# Patient Record
Sex: Female | Born: 1975 | Race: White | Hispanic: No | Marital: Married | Smoking: Never smoker
Health system: Southern US, Community
[De-identification: ages and names within clinical notes are randomized; demographics above are authoritative.]

## PROBLEM LIST (undated history)

## (undated) DIAGNOSIS — O24419 Gestational diabetes mellitus in pregnancy, unspecified control: Secondary | ICD-10-CM

## (undated) HISTORY — PX: KNEE CARTILAGE SURGERY: SHX688

## (undated) HISTORY — DX: Gestational diabetes mellitus in pregnancy, unspecified control: O24.419

---

## 1998-06-30 ENCOUNTER — Other Ambulatory Visit: Admission: RE | Admit: 1998-06-30 | Discharge: 1998-06-30 | Payer: Self-pay | Admitting: Obstetrics & Gynecology

## 1999-01-31 ENCOUNTER — Inpatient Hospital Stay (HOSPITAL_COMMUNITY): Admission: AD | Admit: 1999-01-31 | Discharge: 1999-01-31 | Payer: Self-pay | Admitting: Obstetrics and Gynecology

## 1999-02-02 ENCOUNTER — Inpatient Hospital Stay (HOSPITAL_COMMUNITY): Admission: AD | Admit: 1999-02-02 | Discharge: 1999-02-05 | Payer: Self-pay | Admitting: *Deleted

## 2001-09-19 ENCOUNTER — Inpatient Hospital Stay (HOSPITAL_COMMUNITY): Admission: AD | Admit: 2001-09-19 | Discharge: 2001-09-22 | Payer: Self-pay | Admitting: Obstetrics

## 2004-07-05 ENCOUNTER — Inpatient Hospital Stay (HOSPITAL_COMMUNITY): Admission: AD | Admit: 2004-07-05 | Discharge: 2004-07-07 | Payer: Self-pay | Admitting: Obstetrics and Gynecology

## 2005-09-24 ENCOUNTER — Emergency Department: Payer: Self-pay | Admitting: Emergency Medicine

## 2005-09-24 ENCOUNTER — Other Ambulatory Visit: Payer: Self-pay

## 2006-10-19 ENCOUNTER — Inpatient Hospital Stay: Payer: Self-pay | Admitting: Obstetrics and Gynecology

## 2010-04-04 ENCOUNTER — Inpatient Hospital Stay: Payer: Self-pay | Admitting: Obstetrics and Gynecology

## 2013-06-11 ENCOUNTER — Inpatient Hospital Stay: Payer: Self-pay

## 2013-06-11 LAB — CBC WITH DIFFERENTIAL/PLATELET
Basophil #: 0 10*3/uL (ref 0.0–0.1)
Basophil %: 0.2 %
EOS PCT: 0.2 %
Eosinophil #: 0 10*3/uL (ref 0.0–0.7)
HCT: 31.8 % — ABNORMAL LOW (ref 35.0–47.0)
HGB: 10.2 g/dL — ABNORMAL LOW (ref 12.0–16.0)
LYMPHS ABS: 1.3 10*3/uL (ref 1.0–3.6)
Lymphocyte %: 12.8 %
MCH: 26.9 pg (ref 26.0–34.0)
MCHC: 32.1 g/dL (ref 32.0–36.0)
MCV: 84 fL (ref 80–100)
Monocyte #: 0.7 x10 3/mm (ref 0.2–0.9)
Monocyte %: 7 %
Neutrophil #: 8 10*3/uL — ABNORMAL HIGH (ref 1.4–6.5)
Neutrophil %: 79.8 %
Platelet: 195 10*3/uL (ref 150–440)
RBC: 3.8 10*6/uL (ref 3.80–5.20)
RDW: 15.1 % — ABNORMAL HIGH (ref 11.5–14.5)
WBC: 10 10*3/uL (ref 3.6–11.0)

## 2013-06-13 LAB — HEMATOCRIT: HCT: 30.7 % — AB (ref 35.0–47.0)

## 2015-03-06 NOTE — L&D Delivery Note (Signed)
Delivery Note Primary OB: [facility] Delivery Physician: Ashley MajorPaul Olene Godfrey, MD Gestational Age: Full term Antepartum complications: gestational diabetes and obesity and AMA and grand multiparity Intrapartum complications: None  A viable Female was delivered via vertex perentation.  Apgars:8 ,9  Weight:  Not done yet .   Placenta status: spontaneous and Intact.  Cord: 3+ vessels;  with the following complications: none.  Anesthesia:  epidural Episiotomy:  none Lacerations:  1st Suture Repair: 2.0 vicryl none Est. Blood Loss (mL):  200 mL  Mom to postpartum.  Baby to Couplet care / Skin to Skin.  Ashley MajorPaul Sophiya Morello, MD Dept of OB/GYN [phone]

## 2015-11-29 ENCOUNTER — Encounter: Payer: Self-pay | Attending: Obstetrics & Gynecology | Admitting: *Deleted

## 2015-11-29 ENCOUNTER — Encounter: Payer: Self-pay | Admitting: *Deleted

## 2015-11-29 VITALS — BP 102/64 | Ht 70.0 in | Wt 291.3 lb

## 2015-11-29 DIAGNOSIS — O24410 Gestational diabetes mellitus in pregnancy, diet controlled: Secondary | ICD-10-CM

## 2015-11-29 DIAGNOSIS — Z713 Dietary counseling and surveillance: Secondary | ICD-10-CM | POA: Insufficient documentation

## 2015-11-29 DIAGNOSIS — O99810 Abnormal glucose complicating pregnancy: Secondary | ICD-10-CM | POA: Insufficient documentation

## 2015-11-29 NOTE — Patient Instructions (Signed)
Read booklet on Gestational Diabetes Follow Gestational Meal Planning Guidelines Limit desserts/sweets Avoid cold cereal for breakfast Complete a 3 Day Food Record and bring to next appointment Check blood sugars 4 x day - before breakfast and 2 hrs after every meal and record  Bring blood sugar log to all appointments Call MD for prescription for meter strips and lancets Strips   Accu-Chek Guide        Lancets   Accu-Chek FastClix Purchase urine ketone strips if blood sugars not controlled and check urine ketones every am:  If + increase bedtime snack to 1 protein and 2 carbohydrate servings Walk 20-30 minutes at least 5 x week if permitted by MD

## 2015-11-29 NOTE — Progress Notes (Signed)
Diabetes Self-Management Education  Visit Type: First/Initial  Appt. Start Time: 1335 Appt. End Time: 1515  11/29/2015  [name], identified by name and date of birth, is a 40 y.o. female with a diagnosis of Diabetes: Gestational Diabetes.   ASSESSMENT  Blood pressure 102/64, height 5\' 10"  (1.778 m), weight 291 lb 4.8 oz (132.1 kg), last menstrual period 04/16/2015. Body mass index is 41.8 kg/m.      Diabetes Self-Management Education - 11/29/15 1654      Visit Information   Visit Type First/Initial     Initial Visit   Diabetes Type Gestational Diabetes   Are you currently following a meal plan? No   Are you taking your medications as prescribed? Yes   Date Diagnosed 1 week ago     Health Coping   How would you rate your overall health? Good     Psychosocial Assessment   Patient Belief/Attitude about Diabetes Motivated to manage diabetes   Self-care barriers None   Self-management support Doctor's office;Internet communities   Patient Concerns Glycemic Control;Monitoring;Weight Control;Healthy Lifestyle   Special Needs None   Preferred Learning Style Auditory;Visual;Hands on   Learning Readiness Change in progress   How often do you need to have someone help you when you read instructions, pamphlets, or other written materials from your doctor or pharmacy? 1 - Never   What is the last grade level you completed in school? some college     Pre-Education Assessment   Patient understands the diabetes disease and treatment process. Needs Instruction   Patient understands incorporating nutritional management into lifestyle. Needs Instruction   Patient undertands incorporating physical activity into lifestyle. Needs Instruction   Patient understands using medications safely. Needs Instruction   Patient understands monitoring blood glucose, interpreting and using results Needs Instruction   Patient understands prevention, detection, and treatment of acute  complications. Needs Instruction   Patient understands prevention, detection, and treatment of chronic complications. Needs Instruction   Patient understands how to develop strategies to address psychosocial issues. Needs Instruction   Patient understands how to develop strategies to promote health/change behavior. Needs Instruction     Complications   How often do you check your blood sugar? 0 times/day (not testing)  Provided Accu-Chek Guide meter and instructed on use. BG upon return demonstration was 156 mg/dL at 1:61 pm - 2 1/2 hrs pp.   Have you had a dilated eye exam in the past 12 months? No   Have you had a dental exam in the past 12 months? No   Are you checking your feet? No     Dietary Intake   Breakfast cereal or apple with peanut butter or poptart or cinnamon roll   Lunch left overs or peanut butter sandwich or microwave meal   Dinner meat and potatoes, salad, spaghetti   Beverage(s) water     Exercise   Exercise Type ADL's     Patient Education   Previous Diabetes Education No   Disease state  Definition of diabetes, type 1 and 2, and the diagnosis of diabetes   Nutrition management  Role of diet in the treatment of diabetes and the relationship between the three main macronutrients and blood glucose level   Physical activity and exercise  Role of exercise on diabetes management, blood pressure control and cardiac health.   Monitoring Taught/evaluated SMBG meter.;Purpose and frequency of SMBG.;Taught/discussed recording of test results and interpretation of SMBG.;Ketone testing, when, how.   Chronic complications Relationship between chronic complications and  blood glucose control   Psychosocial adjustment Identified and addressed patients feelings and concerns about diabetes   Preconception care Pregnancy and GDM  Role of pre-pregnancy blood glucose control on the development of the fetus;Reviewed with patient blood glucose goals with pregnancy;Role of family planning for  patients with diabetes     Individualized Goals (developed by patient)   Reducing Risk Improve blood sugars Prevent diabetes complications Lose weight Lead a healthier lifestyle     Outcomes   Expected Outcomes Demonstrated interest in learning. Expect positive outcomes   Future DMSE 2 wks      Individualized Plan for Diabetes Self-Management Training:   Learning Objective:  Patient will have a greater understanding of diabetes self-management. Patient education plan is to attend individual and/or group sessions per assessed needs and concerns.   Plan:   Patient Instructions  Read booklet on Gestational Diabetes Follow Gestational Meal Planning Guidelines Limit desserts/sweets Avoid cold cereal for breakfast Complete a 3 Day Food Record and bring to next appointment Check blood sugars 4 x day - before breakfast and 2 hrs after every meal and record  Bring blood sugar log to all appointments Call MD for prescription for meter strips and lancets Strips   Accu-Chek Guide        Lancets   Accu-Chek FastClix Purchase urine ketone strips if blood sugars not controlled and check urine ketones every am:  If + increase bedtime snack to 1 protein and 2 carbohydrate servings Walk 20-30 minutes at least 5 x week if permitted by MD   Expected Outcomes:  Demonstrated interest in learning. Expect positive outcomes  Education material provided:  Gestational Booklet Gestational Meal Planning Guidelines Viewed Gestational Diabetes Video Meter = Accu-Chek Guide 3 Day Food Record Goals for a Healthy Pregnancy  If problems or questions, patient to contact team via:  Sharion SettlerSheila Shotwell, RN, CCM, CDE [phone]  Future DSME appointment: 2 wks  December 15, 2015 for appointment with the dietitian

## 2015-12-15 ENCOUNTER — Telehealth: Payer: Self-pay | Admitting: Dietician

## 2015-12-15 ENCOUNTER — Ambulatory Visit: Payer: Self-pay | Admitting: Dietician

## 2015-12-15 NOTE — Telephone Encounter (Signed)
Patient cancelled today's appointment due to another appointment at the same time. She left a message asking if she needs to reschedule as she feels she is doing well and her due date is soon. Called her home phone and left a message of the call with another person. Also called her cell phone and left a voicemail message stating that she is not obligated to reschedule considering her circumstances, but we would be glad to reschedule if she would like further assistance. Requested she call back to let us know how she would like to proceed.

## 2015-12-16 ENCOUNTER — Encounter: Payer: Self-pay | Admitting: Dietician

## 2015-12-16 ENCOUNTER — Telehealth: Payer: Self-pay | Admitting: Dietician

## 2015-12-16 NOTE — Telephone Encounter (Signed)
Patient returned call; she has elected not to schedule follow-up. Will send discharge letter to MD.

## 2015-12-16 NOTE — Progress Notes (Signed)
Patient declined returning for RD visit, stating she is doing well with her due date near. Sent discharge letter to MD.

## 2015-12-29 LAB — OB RESULTS CONSOLE GBS
GBS: NEGATIVE
GBS: POSITIVE
STREP GROUP B AG: POSITIVE

## 2016-01-16 ENCOUNTER — Inpatient Hospital Stay
Admission: EM | Admit: 2016-01-16 | Discharge: 2016-01-21 | DRG: 775 | Disposition: A | Discharge status: Home / Self Care | Payer: Self-pay | Attending: Obstetrics & Gynecology | Admitting: Obstetrics & Gynecology

## 2016-01-16 DIAGNOSIS — O99824 Streptococcus B carrier state complicating childbirth: Secondary | ICD-10-CM | POA: Diagnosis present

## 2016-01-16 DIAGNOSIS — Z3A39 39 weeks gestation of pregnancy: Secondary | ICD-10-CM

## 2016-01-16 DIAGNOSIS — O24420 Gestational diabetes mellitus in childbirth, diet controlled: Principal | ICD-10-CM | POA: Diagnosis present

## 2016-01-16 DIAGNOSIS — E669 Obesity, unspecified: Secondary | ICD-10-CM | POA: Diagnosis present

## 2016-01-16 DIAGNOSIS — Z881 Allergy status to other antibiotic agents status: Secondary | ICD-10-CM

## 2016-01-16 DIAGNOSIS — O99214 Obesity complicating childbirth: Secondary | ICD-10-CM | POA: Diagnosis present

## 2016-01-16 DIAGNOSIS — O24419 Gestational diabetes mellitus in pregnancy, unspecified control: Secondary | ICD-10-CM | POA: Diagnosis present

## 2016-01-16 DIAGNOSIS — Z6841 Body Mass Index (BMI) 40.0 and over, adult: Secondary | ICD-10-CM

## 2016-01-16 DIAGNOSIS — D649 Anemia, unspecified: Secondary | ICD-10-CM | POA: Diagnosis present

## 2016-01-16 DIAGNOSIS — O9902 Anemia complicating childbirth: Secondary | ICD-10-CM | POA: Diagnosis present

## 2016-01-16 LAB — CBC
HEMATOCRIT: 33.5 % — AB (ref 35.0–47.0)
HEMOGLOBIN: 11 g/dL — AB (ref 12.0–16.0)
MCH: 26.6 pg (ref 26.0–34.0)
MCHC: 32.8 g/dL (ref 32.0–36.0)
MCV: 80.9 fL (ref 80.0–100.0)
Platelets: 199 10*3/uL (ref 150–440)
RBC: 4.14 MIL/uL (ref 3.80–5.20)
RDW: 16.4 % — ABNORMAL HIGH (ref 11.5–14.5)
WBC: 9 10*3/uL (ref 3.6–11.0)

## 2016-01-16 LAB — TYPE AND SCREEN
ABO/RH(D): O POS
ANTIBODY SCREEN: NEGATIVE

## 2016-01-16 LAB — GLUCOSE, CAPILLARY: Glucose-Capillary: 123 mg/dL — ABNORMAL HIGH (ref 65–99)

## 2016-01-16 MED ORDER — TERBUTALINE SULFATE 1 MG/ML IJ SOLN
0.2500 mg | Freq: Once | INTRAMUSCULAR | Status: DC | PRN
  Filled 2016-01-16: qty 1

## 2016-01-16 MED ORDER — LIDOCAINE HCL (PF) 1 % IJ SOLN
30.0000 mL | INTRAMUSCULAR | Status: DC | PRN
  Filled 2016-01-16: qty 30

## 2016-01-16 MED ORDER — LACTATED RINGERS IV SOLN: 500.0000 mL | INTRAVENOUS | Status: DC | PRN

## 2016-01-16 MED ORDER — LACTATED RINGERS IV SOLN
INTRAVENOUS | Status: DC
  Administered 2016-01-16 – 2016-01-19 (×9): via INTRAVENOUS

## 2016-01-16 MED ORDER — BUTORPHANOL TARTRATE 1 MG/ML IJ SOLN: 1.0000 mg | INTRAMUSCULAR | Status: DC | PRN

## 2016-01-16 MED ORDER — OXYTOCIN 40 UNITS IN LACTATED RINGERS INFUSION - SIMPLE MED
2.5000 [IU]/h | INTRAVENOUS | Status: DC
  Administered 2016-01-20: 2.5 [IU]/h via INTRAVENOUS
  Filled 2016-01-16 (×3): qty 1000

## 2016-01-16 MED ORDER — ACETAMINOPHEN 325 MG PO TABS: 650.0000 mg | ORAL_TABLET | ORAL | Status: DC | PRN

## 2016-01-16 MED ORDER — OXYTOCIN BOLUS FROM INFUSION: 500.0000 mL | Freq: Once | INTRAVENOUS | Status: DC

## 2016-01-16 MED ORDER — ONDANSETRON HCL 4 MG/2ML IJ SOLN: 4.0000 mg | Freq: Four times a day (QID) | INTRAMUSCULAR | Status: DC | PRN

## 2016-01-16 MED ORDER — CLINDAMYCIN PHOSPHATE 900 MG/50ML IV SOLN
900.0000 mg | Freq: Three times a day (TID) | INTRAVENOUS | Status: DC
  Administered 2016-01-17 – 2016-01-18 (×3): 900 mg via INTRAVENOUS
  Filled 2016-01-16 (×3): qty 50

## 2016-01-16 MED ORDER — DINOPROSTONE 10 MG VA INST
10.0000 mg | VAGINAL_INSERT | Freq: Once | VAGINAL | Status: AC
  Administered 2016-01-16: 10 mg via VAGINAL
  Filled 2016-01-16: qty 1

## 2016-01-16 NOTE — H&P (Signed)
OB History & Physical   History of Present Illness:  Chief Complaint: Pt presents to L&D for IOL  HPI:  Colin Broachngela M Spivey is a 40 y.o. [id number] female at 6464w2d dated by LMP.  Her pregnancy has been complicated by AMA >40, Grand Multiparous, Obesity, BMI 39 with total weight gain of 19 pounds, Gestational Diabetes diet controlled, anemia, GBS positive.    She reports contractions.   She denies leakage of fluid.   She denies vaginal bleeding.   She reports fetal movement.    Maternal Medical History:   Past Medical History:  Diagnosis Date  . Gestational diabetes     Past Surgical History:  Procedure Laterality Date  . KNEE CARTILAGE SURGERY      No Known Allergies  Prior to Admission medications   Medication Sig Start Date End Date Taking? Authorizing Provider  IRON, FERROUS SULFATE, PO Take 65 mg by mouth daily.    Historical Provider, MD  Prenatal Vit-Fe Fumarate-FA (PRENATAL ONE DAILY PO) Take 1 tablet by mouth daily.    Historical Provider, MD    OB History  Gravida Para Term Preterm AB Living  1            SAB TAB Ectopic Multiple Live Births               # Outcome Date GA Lbr Len/2nd Weight Sex Delivery Anes PTL Lv  1 Current               Prenatal care site: [facility]  Social History: She  reports that she has never smoked. She has never used smokeless tobacco. She reports that she does not drink alcohol.  Family History: family history is not on file.   Review of Systems: Negative x 10 systems reviewed except as noted in the HPI.    Physical Exam:  Vital Signs: LMP 04/16/2015  General: no acute distress.  HEENT: normocephalic, atraumatic Heart: regular rate & rhythm.  No murmurs/rubs/gallops Lungs: clear to auscultation bilaterally Abdomen: soft, gravid, non-tender;  EFW: 8.5 pounds Pelvic:   External: Normal external female genitalia  Cervix:  2 /  60 /  -1  Extremities: non-tender, symmetric, trace edema bilaterally.  DTRs: 2+  Neurologic:  Alert & oriented x 3.    Pertinent Results:  Prenatal Labs: Blood type/Rh O positive  Antibody screen negative  Rubella Immune  Varicella Immune    RPR Non-Reactive  HBsAg negative  HIV negative  GC negative  Chlamydia negative  Genetic screening declined  1 hour GTT 165  3 hour GTT Abnormal with 3 elevated values  GBS positive on 10/26   Baseline FHR: 135 beats/min   Variability: moderate   Accelerations: present   Decelerations: absent Contractions: present frequency: q 4-8 Overall assessment: Category I   Assessment:  Colin Broachngela M Heupel is a 40 y.o. [id number] female at 5864w2d presenting to L&D for IOL for AMA, Grand Multip, Obesity, GDM.   Plan:  1. Admit to Labor & Delivery  2. CBC, T&S, Clrs, IVF, Blood Glucose monitoring q 4hrs until active labor, then q 2hrs 3. GBS positive; allergic to amoxicillin, treat with clindamycin 4. Fetal well-being: Category I 5. Begin induction with Cervidil  Terrel Nesheiwat, CNM 01/16/2016 9:24 PM

## 2016-01-17 LAB — GLUCOSE, CAPILLARY
GLUCOSE-CAPILLARY: 114 mg/dL — AB (ref 65–99)
GLUCOSE-CAPILLARY: 95 mg/dL (ref 65–99)
GLUCOSE-CAPILLARY: 72 mg/dL (ref 65–99)
Glucose-Capillary: 87 mg/dL (ref 65–99)
Glucose-Capillary: 85 mg/dL (ref 65–99)

## 2016-01-17 MED ORDER — TERBUTALINE SULFATE 1 MG/ML IJ SOLN
0.2500 mg | Freq: Once | INTRAMUSCULAR | Status: DC | PRN
  Filled 2016-01-17: qty 1

## 2016-01-17 MED ORDER — ZOLPIDEM TARTRATE 5 MG PO TABS
ORAL_TABLET | ORAL | Status: AC
  Administered 2016-01-17: 5 mg via ORAL
  Filled 2016-01-17: qty 1

## 2016-01-17 MED ORDER — ZOLPIDEM TARTRATE 5 MG PO TABS
5.0000 mg | ORAL_TABLET | Freq: Every evening | ORAL | Status: DC | PRN
  Administered 2016-01-17: 5 mg via ORAL

## 2016-01-17 MED ORDER — OXYTOCIN 40 UNITS IN LACTATED RINGERS INFUSION - SIMPLE MED
1.0000 m[IU]/min | INTRAVENOUS | Status: DC
  Administered 2016-01-17 – 2016-01-19 (×2): 1 m[IU]/min via INTRAVENOUS
  Filled 2016-01-17: qty 1000

## 2016-01-17 NOTE — Progress Notes (Signed)
L&D Note  01/17/2016 - 10:53 AM  40 y.o. [id number] 6657w3d   Ashley Chaney is admitted for IOL for GDM, AMA   Subjective:  Comfortable, resting with eyes closed Objective:   Vitals:   01/16/16 2133 01/17/16 0106 01/17/16 0436 01/17/16 0723  BP:  129/75 (!) 99/50 (!) 110/54  Pulse:  89 88 81  Resp:   16 18  Temp:  98 F (36.7 C) 98.2 F (36.8 C) 98.2 F (36.8 C)  TempSrc:  Oral Oral Oral  Weight: 294 lb (133.4 kg)     Height: 5\' 10"  (1.778 m)       Current Vital Signs 24h Vital Sign Ranges  T 98.2 F (36.8 C) Temp  Avg: 98.1 F (36.7 C)  Min: 98 F (36.7 C)  Max: 98.2 F (36.8 C)  BP (!) 110/54 BP  Min: 99/50  Max: 129/75  HR 81 Pulse  Avg: 92.8  Min: 81  Max: 113  RR 18 Resp  Avg: 17.3  Min: 16  Max: 18  SaO2     No Data Recorded       24 Hour I/O Current Shift I/O  Time Ins Outs No intake/output data recorded. No intake/output data recorded.    FHR: cat 1, baseline 140, + accels, no decels Toco: none picking up on toco SVE: 4/60/-3/soft/anterior   Assessment :  IUP at 2857w3d, IOL for GDM & AMA    Plan:  Pt to Shower and eat first Pitocin induction Pain management as desired: nitrous, stadol and epidural Anticipate vaginal delivery  Ashley Chaney, Ashley Chaney, [place]

## 2016-01-17 NOTE — Progress Notes (Signed)
L&D Note  01/17/2016 - 8:38 PM  40 y.o. [id number] 7152w3d   Ashley Chaney is admitted for IOL   Subjective:  Feeling more contractions Objective:   Vitals:   01/17/16 0723 01/17/16 1412 01/17/16 1801 01/17/16 1933  BP: (!) 110/54 102/61 125/71 139/76  Pulse: 81 (!) 101 79 83  Resp: 18 18  17   Temp: 98.2 F (36.8 C) 98 F (36.7 C) 98.5 F (36.9 C) 98 F (36.7 C)  TempSrc: Oral Oral Oral Oral  Weight:      Height:        Current Vital Signs 24h Vital Sign Ranges  T 98 F (36.7 C) Temp  Avg: 98.1 F (36.7 C)  Min: 98 F (36.7 C)  Max: 98.5 F (36.9 C)  BP 139/76 BP  Min: 99/50  Max: 139/76  HR 83 Pulse  Avg: 90.6  Min: 79  Max: 113  RR 17 Resp  Avg: 17.4  Min: 16  Max: 18  SaO2     No Data Recorded       24 Hour I/O Current Shift I/O  Time Ins Outs No intake/output data recorded. No intake/output data recorded.    FHR: Cat 1 tracing - baseline  130, mod variability, + accels, no decels Toco: q 3-4 min SVE: 4.5/70/-3   Assessment :  IUP at 3152w3d, GDM, AMA    Plan:  Continue Pitocin titration, pain medication when indicated.  Anticipate vaginal delivery  [name], Abhishek Levesque, [place]

## 2016-01-18 LAB — RPR: RPR: NONREACTIVE

## 2016-01-18 LAB — GLUCOSE, CAPILLARY
GLUCOSE-CAPILLARY: 67 mg/dL (ref 65–99)
GLUCOSE-CAPILLARY: 73 mg/dL (ref 65–99)
GLUCOSE-CAPILLARY: 74 mg/dL (ref 65–99)
Glucose-Capillary: 72 mg/dL (ref 65–99)
Glucose-Capillary: 68 mg/dL (ref 65–99)
Glucose-Capillary: 127 mg/dL — ABNORMAL HIGH (ref 65–99)

## 2016-01-18 MED ORDER — CEFAZOLIN IN D5W 1 GM/50ML IV SOLN
1.0000 g | Freq: Three times a day (TID) | INTRAVENOUS | Status: DC
  Administered 2016-01-18 – 2016-01-19 (×4): 1 g via INTRAVENOUS
  Filled 2016-01-18 (×4): qty 50

## 2016-01-18 MED ORDER — CEFAZOLIN SODIUM-DEXTROSE 2-4 GM/100ML-% IV SOLN
2.0000 g | Freq: Once | INTRAVENOUS | Status: AC
  Administered 2016-01-18: 2 g via INTRAVENOUS
  Filled 2016-01-18: qty 100

## 2016-01-18 MED ORDER — OXYTOCIN 10 UNIT/ML IJ SOLN
INTRAMUSCULAR | Status: AC
  Filled 2016-01-18: qty 2

## 2016-01-18 MED ORDER — MISOPROSTOL 200 MCG PO TABS
ORAL_TABLET | ORAL | Status: AC
  Filled 2016-01-18: qty 4

## 2016-01-18 MED ORDER — MISOPROSTOL 25 MCG QUARTER TABLET
25.0000 ug | ORAL_TABLET | ORAL | Status: DC | PRN
  Administered 2016-01-18 – 2016-01-19 (×2): 25 ug via VAGINAL
  Filled 2016-01-18 (×2): qty 0.25

## 2016-01-18 MED ORDER — AMMONIA AROMATIC IN INHA
RESPIRATORY_TRACT | Status: AC
  Filled 2016-01-18: qty 10

## 2016-01-18 NOTE — Progress Notes (Signed)
Note: patient has allergy to amoxicillin.  Was positive for GBS in office with no sensitivity testing to clindamycin.  Will change her antibiotic prophylaxis to cefazolin given her low-risk reaction to amoxicillin.    Also, reviewed last growth scan of fetus and was within normal limits (3,197 grams on 12/23/15) at 76th %ile. AC was 95th %ile.  Will monitor for normal progression once we note cervical change.   Thomasene MohairStephen Jackson, MD 01/18/2016 9:29 AM

## 2016-01-18 NOTE — Progress Notes (Signed)
Patient ID: Ashley Chaney, female   DOB: August 04, 1975, 40 y.o.   MRN: [id number]  Discussed plan for moving forward. Option to go home and come back in a couple of days reviewed and dismissed.   Discussed option of using misoprostol for 1-3 doses versus starting pitocin.  Discussed risks/benefits of misoprostol, including hyperstimulation of the uterus.   Patient voiced understanding and risks and potential benefits and would like to move forward w misoprostol.  Thomasene MohairStephen Jackson, MD 01/18/2016 9:33 PM

## 2016-01-18 NOTE — Progress Notes (Signed)
S: Feeling contractions, not very painful  O: Cervix 5/75/-3, ballotable, cat 1 tracing, ctx q 2-5 min, toco not graphing well periodically  A: IUP at 7717w4d, IOL for AMA, A1GDM  P: Continue Pitocin titration (currently at 12)  Encouraged upright or birthing ball positioning Attempt AROM once cephalic lower station

## 2016-01-18 NOTE — Progress Notes (Signed)
Labor Check  Subj:  Complaints: no change   Obj:  BP (!) 112/50 (BP Location: Right Arm)   Pulse 70   Temp 98 F (36.7 C) (Oral)   Resp 18   Ht 5\' 10"  (1.778 m)   Wt 294 lb (133.4 kg)   LMP 04/16/2015   BMI 42.18 kg/m  Dose (milli-units/min) Oxytocin: 0 milli-units/min  Cervix: Dilation: 5 / Effacement (%): 70 / Station: Ballotable  Baseline FHR: 130    Variability: moderate    Accelerations: present    Decelerations: absent Contractions: present frequency: not tracing well  A/P: 40 y.o. [id number] female at 6976w4d with IOL.  1.  Labor: stop pitocin for now.  Discussed possible discharge with readmit in a couple days. Patient wants a pitocin break for now, possible restart in about 6 hours, possible use of misoprostol at that time.  2.  FWB: reassuring overall, Overall assessment: category 1  3.  GBS positive - ancef per CDC protocol  4.  Pain: prn 5.  Recheck: prn   Thomasene MohairStephen Jackson, MD 01/18/2016 2:10 PM

## 2016-01-18 NOTE — Progress Notes (Signed)
Labor Check  Subj:  Complaints: feels contractions   Obj:  BP (!) 113/50 (BP Location: Right Arm)   Pulse 70   Temp 97.9 F (36.6 C) (Oral)   Resp 18   Ht 5\' 10"  (1.778 m)   Wt 294 lb (133.4 kg)   LMP 04/16/2015   BMI 42.18 kg/m  Dose (milli-units/min) Oxytocin: 20 milli-units/min  Cervix: Dilation: 5 / Effacement (%): 70 / Station: Ballotable  Baseline FHR: 125    Variability: moderate    Accelerations: present    Decelerations: absent Contractions: present  frequency: 2-3 q 10 min  A/P: 40 y.o. [id number] female at 73110w4d with IOL for GDMA1, AMA, obesity, grand multip.  1.  Labor: continue pitocin until able to AROM.  Will increase up to 30.  2.  FWB: reassuring, Overall assessment: category 1  3.  GBS positive.  Check to verify sensitivities were checked as she is taking clindamycin. 4.  Pain: prn 5.  Recheck: prn   Thomasene MohairStephen Shivangi Lutz, MD 01/18/2016 8:54 AM

## 2016-01-19 ENCOUNTER — Inpatient Hospital Stay: Payer: Self-pay | Admitting: Certified Registered Nurse Anesthetist

## 2016-01-19 DIAGNOSIS — O24419 Gestational diabetes mellitus in pregnancy, unspecified control: Secondary | ICD-10-CM | POA: Diagnosis present

## 2016-01-19 LAB — GLUCOSE, CAPILLARY
GLUCOSE-CAPILLARY: 64 mg/dL — AB (ref 65–99)
Glucose-Capillary: 75 mg/dL (ref 65–99)
Glucose-Capillary: 73 mg/dL (ref 65–99)
Glucose-Capillary: 74 mg/dL (ref 65–99)
Glucose-Capillary: 74 mg/dL (ref 65–99)

## 2016-01-19 MED ORDER — FENTANYL 2.5 MCG/ML W/ROPIVACAINE 0.2% IN NS 100 ML EPIDURAL INFUSION (ARMC-ANES)
EPIDURAL | Status: AC
  Filled 2016-01-19: qty 100

## 2016-01-19 MED ORDER — LIDOCAINE-EPINEPHRINE (PF) 1.5 %-1:200000 IJ SOLN
INTRAMUSCULAR | Status: DC | PRN
  Administered 2016-01-19: 3 mL via EPIDURAL

## 2016-01-19 MED ORDER — FENTANYL 2.5 MCG/ML W/ROPIVACAINE 0.2% IN NS 100 ML EPIDURAL INFUSION (ARMC-ANES)
EPIDURAL | Status: DC | PRN
  Administered 2016-01-19: 10 mL/h via EPIDURAL

## 2016-01-19 MED ORDER — BUPIVACAINE HCL (PF) 0.25 % IJ SOLN
INTRAMUSCULAR | Status: DC | PRN
  Administered 2016-01-19: 4 mL via EPIDURAL
  Administered 2016-01-19: 5 mL via EPIDURAL

## 2016-01-19 MED ORDER — LIDOCAINE HCL (PF) 1 % IJ SOLN
INTRAMUSCULAR | Status: DC | PRN
  Administered 2016-01-19: 3 mL

## 2016-01-19 NOTE — Progress Notes (Signed)
  Labor Progress Note   40 y.o. [id number] @ 8555w5d , admitted for  Pregnancy, Labor Management.   Subjective:  Comfortable w Epidural. AROM 1330 Pitocin 21 mU/min  Objective:  BP 131/77   Pulse 74   Temp 98.4 F (36.9 C) (Oral)   Resp 18   Ht 5\' 10"  (1.778 m)   Wt 294 lb (133.4 kg)   LMP 04/16/2015   SpO2 100%   BMI 42.18 kg/m  Abd: mild Extr: trace to 1+ bilateral pedal edema SVE: CERVIX: 10 cm /100/ +1  EFM: FHR: 140 bpm, variability: moderate,  accelerations:  Present,  decelerations:  Absent Toco: Frequency: Every 2 minutes  Assessment & Plan:  [id number] @ 455w5d, admitted for  Pregnancy and Labor/Delivery Management  1. Pain management: epidural. 2. FWB: FHT category 1.  3. ID: GBS positive 4. Labor management: Second stage 5. GDM: BS WNL  All discussed with patient, see orders

## 2016-01-19 NOTE — Discharge Summary (Addendum)
Obstetrical Discharge Summary  Date of Admission: 01/16/2016 Date of Discharge: 01/21/2016  Discharge Diagnosis: Term Pregnancy-delivered and gestational diabetes and obesity and AMA and grand multiparity Primary OB:  [facility]   Gestational Age at Delivery: 2922w5d  Antepartum complications: gestational diabetes and obesity and AMA and grand multiparity Date of Delivery: 01/19/2016  Delivered By: Annamarie MajorPaul Harris, MD Delivery Type: spontaneous vaginal delivery Intrapartum complications/course: None Anesthesia: epidural Placenta: spontaneous Laceration: 1st degree Episiotomy: none Live born female  Birth Weight:  9 lb 3 oz APGAR: 8, 9   Post partum course: Since the delivery, patient has tolerate activity, diet, and daily functions without difficulty or complication.  Min lochia.  No breast concerns at this time.  No signs of depression currently.   BP 130/65 (BP Location: Right Arm)   Pulse 62   Temp 98 F (36.7 C) (Oral)   Resp 20   Ht 5\' 10"  (1.778 m)   Wt 294 lb (133.4 kg)   LMP 04/16/2015   SpO2 100%   Breastfeeding? Unknown   BMI 42.18 kg/m   Postpartum Exam:General appearance: alert, cooperative and no distress GI: soft, non-tender; bowel sounds normal; no masses,  no organomegaly and Fundus firm Extremities: extremities normal, atraumatic, no cyanosis or edema  Disposition: home without infant (infant staying due to elevated bilirubin and mom rooming-in) Rh Immune globulin given: not applicable Rubella vaccine given: no Varicella vaccine given: no Tdap vaccine given in AP or PP setting: no Flu vaccine given in AP or PP setting: no Contraception: to be determined at post partum visit  Prenatal Labs: O POS//Rubella Immune//RPR negative//HIV negative/HepB Surface Ag negative//plans to breastfeed  Plan:  Ashley Chaney was discharged to home in good condition. Follow-up Information    Letitia Libraobert Paul Harris, MD Follow up in 6 week(s).   Specialty:  Obstetrics and  Gynecology Why:  patient to schedule 2 hour glucose tolerance test at this visit Contact information: [address] [place] [address] [phone]          Discharge Medications:   Medication List    TAKE these medications   ibuprofen 600 MG tablet Commonly known as:  ADVIL,MOTRIN Take 1 tablet (600 mg total) by mouth every 6 (six) hours.   IRON (FERROUS SULFATE) PO Take 65 mg by mouth daily.   PRENATAL ONE DAILY PO Take 1 tablet by mouth daily.      Thomasene MohairStephen Elajah Kunsman, MD 01/21/2016 9:34 AM

## 2016-01-19 NOTE — Progress Notes (Signed)
L&D Progress Note;   Day 3 of induction for GDM and maternal age of 40 CGA 8439 wk5 day. S: Tired, but contractions mild O: BP126/74 FHR:125-130 with accelerations to 140s and moderate variability Toco: contractions q 2-6 min on 7 miu/min Pitocin Cervix: 4/60%/-2 to -3 CBG all WNL except for a 127 after eating Cephalic on ultrasound  A: IUP at 39 5/7 weeks Day 3 of IOL for GDM and AMA>40 NO progression FWB- Cat 1  P: Continue Pitocin, will try to needle membranes when in a better contracting pattern.

## 2016-01-19 NOTE — Progress Notes (Signed)
L&D Note   S: Still quite comfortable, just awoke from a nap  O: 111/55 FHR: 135 baseline with accelerations to 150, moderate variability, had several variabile decelerations with contractions after AROM to 70 BPM x 20-40 sec, which resolved. AROM: large amt clear fluid at 1330 by applying FSE IUPC inserted: contractions q 3-4 Pitocin decreased from 15 miu/min to 119miu/min with AROM. Cervix: 4/60-70%/-2/ anterior  A: IOL in progress  P: Monitor FWB and cervical change Epidural when desires Plan of management discussed with Dr Tiburcio PeaHarris.  Ashley Chaney, Brenden Rudman, CNM

## 2016-01-19 NOTE — Anesthesia Preprocedure Evaluation (Signed)
Anesthesia Evaluation  Patient identified by MRN, date of birth, ID band Patient awake    Reviewed: Allergy & Precautions, NPO status , Patient's Chart, lab work & pertinent test results  Airway Mallampati: II  TM Distance: >3 FB Neck ROM: Full    Dental   Pulmonary           Cardiovascular      Neuro/Psych    GI/Hepatic   Endo/Other  diabetes, Gestational  Renal/GU      Musculoskeletal   Abdominal   Peds  Hematology   Anesthesia Other Findings   Reproductive/Obstetrics (+) Pregnancy                             Anesthesia Physical Anesthesia Plan  ASA: II  Anesthesia Plan: Epidural   Post-op Pain Management:    Induction:   Airway Management Planned:   Additional Equipment:   Intra-op Plan:   Post-operative Plan:   Informed Consent:   Plan Discussed with: CRNA and Anesthesiologist  Anesthesia Plan Comments:         Anesthesia Quick Evaluation

## 2016-01-19 NOTE — Anesthesia Procedure Notes (Signed)
Epidural Patient location during procedure: OB Start time: 01/19/2016 2:33 PM End time: 01/19/2016 2:45 PM  Staffing Anesthesiologist: Yves DillARROLL, PAUL Resident/CRNA: Ginger CarneMICHELET, STEPHANIE Performed: resident/CRNA   Preanesthetic Checklist Completed: patient identified, site marked, surgical consent, pre-op evaluation, timeout performed, IV checked, risks and benefits discussed and monitors and equipment checked  Epidural Patient position: sitting Prep: Betadine Patient monitoring: heart rate, continuous pulse ox and blood pressure Approach: midline Location: L4-L5 Injection technique: LOR saline  Needle:  Needle type: Tuohy  Needle gauge: 17 G Needle length: 9 cm and 9 Needle insertion depth: 9 cm Catheter type: closed end flexible Catheter size: 19 Gauge Catheter at skin depth: 15 cm Test dose: negative and 1.5% lidocaine with Epi 1:200 K  Assessment Sensory level: T10 Events: blood not aspirated, injection not painful, no injection resistance, negative IV test and no paresthesia  Additional Notes Pt. Evaluated and documentation done after procedure finished. Patient identified. Risks/Benefits/Options discussed with patient including but not limited to bleeding, infection, nerve damage, paralysis, failed block, incomplete pain control, headache, blood pressure changes, nausea, vomiting, reactions to medication both or allergic, itching and postpartum back pain. Confirmed with bedside nurse the patient's most recent platelet count. Confirmed with patient that they are not currently taking any anticoagulation, have any bleeding history or any family history of bleeding disorders. Patient expressed understanding and wished to proceed. All questions were answered. Sterile technique was used throughout the entire procedure. Please see nursing notes for vital signs. Test dose was given through epidural catheter and negative prior to continuing to dose epidural or start infusion. Warning  signs of high block given to the patient including shortness of breath, tingling/numbness in hands, complete motor block, or any concerning symptoms with instructions to call for help. Patient was given instructions on fall risk and not to get out of bed. All questions and concerns addressed with instructions to call with any issues or inadequate analgesia.   Patient tolerated the insertion well without immediate complications.Reason for block:procedure for pain

## 2016-01-19 NOTE — Progress Notes (Signed)
  Labor Progress Note   40 y.o. [id number] @ 614w5d , admitted for  Pregnancy, Labor Management.   Subjective:  Comfortable w Epidural. AROM 1330 Pitocin 19 mU/min  Objective:  BP 114/67   Pulse 70   Temp 97.9 F (36.6 C) (Oral)   Resp 18   Ht 5\' 10"  (1.778 m)   Wt 294 lb (133.4 kg)   LMP 04/16/2015   SpO2 100%   BMI 42.18 kg/m  Abd: mild Extr: trace to 1+ bilateral pedal edema SVE: CERVIX: 5 cm dilated last RN check  EFM: FHR: 140 bpm, variability: moderate,  accelerations:  Present,  decelerations:  Absent Toco: Frequency: Every 5 minutes Labs: I have reviewed the patient's lab results.   Assessment & Plan:  [id number] @ 794w5d, admitted for  Pregnancy and Labor/Delivery Management  1. Pain management: epidural. 2. FWB: FHT category 1.  3. ID: GBS positive 4. Labor management: Cont Pitocin, AROM already done, Epidural in place, no other options.  Discussed continued labor vs CS. 5. GDM: BS WNL  All discussed with patient, see orders

## 2016-01-19 NOTE — Discharge Instructions (Signed)

## 2016-01-20 LAB — COMPREHENSIVE METABOLIC PANEL
ALT: 7 U/L — ABNORMAL LOW (ref 14–54)
AST: 19 U/L (ref 15–41)
Albumin: 2.4 g/dL — ABNORMAL LOW (ref 3.5–5.0)
Alkaline Phosphatase: 127 U/L — ABNORMAL HIGH (ref 38–126)
Anion gap: 7 (ref 5–15)
BILIRUBIN TOTAL: 0.4 mg/dL (ref 0.3–1.2)
BUN: 9 mg/dL (ref 6–20)
CHLORIDE: 106 mmol/L (ref 101–111)
CO2: 21 mmol/L — ABNORMAL LOW (ref 22–32)
Calcium: 8.6 mg/dL — ABNORMAL LOW (ref 8.9–10.3)
Creatinine, Ser: 0.66 mg/dL (ref 0.44–1.00)
Glucose, Bld: 90 mg/dL (ref 65–99)
POTASSIUM: 3.6 mmol/L (ref 3.5–5.1)
Sodium: 134 mmol/L — ABNORMAL LOW (ref 135–145)
TOTAL PROTEIN: 5.4 g/dL — AB (ref 6.5–8.1)

## 2016-01-20 LAB — CBC
HEMATOCRIT: 29.1 % — AB (ref 35.0–47.0)
Hemoglobin: 9.5 g/dL — ABNORMAL LOW (ref 12.0–16.0)
MCH: 26.5 pg (ref 26.0–34.0)
MCHC: 32.8 g/dL (ref 32.0–36.0)
MCV: 80.8 fL (ref 80.0–100.0)
PLATELETS: 148 10*3/uL — AB (ref 150–440)
RBC: 3.6 MIL/uL — ABNORMAL LOW (ref 3.80–5.20)
RDW: 16.5 % — AB (ref 11.5–14.5)
WBC: 8.1 10*3/uL (ref 3.6–11.0)

## 2016-01-20 LAB — PROTEIN / CREATININE RATIO, URINE
CREATININE, URINE: 139 mg/dL
Protein Creatinine Ratio: 0.22 mg/mg{Cre} — ABNORMAL HIGH (ref 0.00–0.15)
TOTAL PROTEIN, URINE: 30 mg/dL

## 2016-01-20 MED ORDER — ACETAMINOPHEN 325 MG PO TABS: 650.0000 mg | ORAL_TABLET | ORAL | Status: DC | PRN

## 2016-01-20 MED ORDER — SIMETHICONE 80 MG PO CHEW: 80.0000 mg | CHEWABLE_TABLET | ORAL | Status: DC | PRN

## 2016-01-20 MED ORDER — DIBUCAINE 1 % RE OINT: 1.0000 "application " | TOPICAL_OINTMENT | RECTAL | Status: DC | PRN

## 2016-01-20 MED ORDER — WITCH HAZEL-GLYCERIN EX PADS: 1.0000 "application " | MEDICATED_PAD | CUTANEOUS | Status: DC | PRN

## 2016-01-20 MED ORDER — SENNOSIDES-DOCUSATE SODIUM 8.6-50 MG PO TABS
2.0000 | ORAL_TABLET | ORAL | Status: DC
  Administered 2016-01-20 – 2016-01-21 (×2): 2 via ORAL
  Filled 2016-01-20 (×2): qty 2

## 2016-01-20 MED ORDER — ZOLPIDEM TARTRATE 5 MG PO TABS: 5.0000 mg | ORAL_TABLET | Freq: Every evening | ORAL | Status: DC | PRN

## 2016-01-20 MED ORDER — IBUPROFEN 600 MG PO TABS
600.0000 mg | ORAL_TABLET | Freq: Four times a day (QID) | ORAL | Status: DC
  Administered 2016-01-20 – 2016-01-21 (×6): 600 mg via ORAL
  Filled 2016-01-20 (×6): qty 1

## 2016-01-20 MED ORDER — ONDANSETRON HCL 4 MG/2ML IJ SOLN: 4.0000 mg | INTRAMUSCULAR | Status: DC | PRN

## 2016-01-20 MED ORDER — DIPHENHYDRAMINE HCL 25 MG PO CAPS: 25.0000 mg | ORAL_CAPSULE | Freq: Four times a day (QID) | ORAL | Status: DC | PRN

## 2016-01-20 MED ORDER — COCONUT OIL OIL
1.0000 "application " | TOPICAL_OIL | Status: DC | PRN
  Administered 2016-01-21: 1 via TOPICAL
  Filled 2016-01-20: qty 120

## 2016-01-20 MED ORDER — SODIUM CHLORIDE 0.9% FLUSH: 3.0000 mL | INTRAVENOUS | Status: DC | PRN

## 2016-01-20 MED ORDER — IBUPROFEN 600 MG PO TABS
600.0000 mg | ORAL_TABLET | Freq: Four times a day (QID) | ORAL | Status: DC
  Administered 2016-01-20: 600 mg via ORAL
  Filled 2016-01-20: qty 1

## 2016-01-20 MED ORDER — SODIUM CHLORIDE 0.9% FLUSH: 3.0000 mL | Freq: Two times a day (BID) | INTRAVENOUS | Status: DC

## 2016-01-20 MED ORDER — ONDANSETRON HCL 4 MG PO TABS: 4.0000 mg | ORAL_TABLET | ORAL | Status: DC | PRN

## 2016-01-20 MED ORDER — SODIUM CHLORIDE 0.9 % IV SOLN: 250.0000 mL | INTRAVENOUS | Status: DC | PRN

## 2016-01-20 MED ORDER — BENZOCAINE-MENTHOL 20-0.5 % EX AERO
1.0000 "application " | INHALATION_SPRAY | CUTANEOUS | Status: DC | PRN
  Administered 2016-01-20: 1 via TOPICAL
  Filled 2016-01-20: qty 56

## 2016-01-20 MED ORDER — OXYTOCIN 40 UNITS IN LACTATED RINGERS INFUSION - SIMPLE MED
INTRAVENOUS | Status: AC
  Administered 2016-01-20: 2.5 [IU]/h via INTRAVENOUS
  Filled 2016-01-20: qty 1000

## 2016-01-20 NOTE — Progress Notes (Signed)
Subjective:   Doing well no concerns, minimal lochia.  No HA, vision changes, RUQ or epigastric pain Objective:   Blood pressure 118/71, pulse (!) 55, temperature 98.3 F (36.8 C), temperature source Oral, resp. rate 18, height 5\' 10"  (1.778 m), weight 294 lb (133.4 kg), last menstrual period 04/16/2015, SpO2 99 %, unknown if currently breastfeeding.  General: NAD Pulmonary: no increased work of breathing Abdomen: non-distended, non-tender, fundus firm at level of umbilicus Extremities: no edema, no erythema, no tenderness  Results for orders placed or performed during the hospital encounter of 01/16/16 (from the past 72 hour(s))  Glucose, capillary     Status: None   Collection Time: 01/17/16 12:08 PM  Result Value Ref Range   Glucose-Capillary 85 65 - 99 mg/dL  Glucose, capillary     Status: None   Collection Time: 01/17/16  4:00 PM  Result Value Ref Range   Glucose-Capillary 95 65 - 99 mg/dL   Comment 1 Notify RN   Glucose, capillary     Status: None   Collection Time: 01/17/16  8:08 PM  Result Value Ref Range   Glucose-Capillary 72 65 - 99 mg/dL  Glucose, capillary     Status: None   Collection Time: 01/18/16 12:20 AM  Result Value Ref Range   Glucose-Capillary 73 65 - 99 mg/dL  Glucose, capillary     Status: None   Collection Time: 01/18/16  4:12 AM  Result Value Ref Range   Glucose-Capillary 74 65 - 99 mg/dL  Glucose, capillary     Status: None   Collection Time: 01/18/16  8:12 AM  Result Value Ref Range   Glucose-Capillary 67 65 - 99 mg/dL   Comment 1 Notify RN   Glucose, capillary     Status: None   Collection Time: 01/18/16 12:13 PM  Result Value Ref Range   Glucose-Capillary 72 65 - 99 mg/dL   Comment 1 Notify RN   Glucose, capillary     Status: None   Collection Time: 01/18/16  5:14 PM  Result Value Ref Range   Glucose-Capillary 68 65 - 99 mg/dL  Glucose, capillary     Status: Abnormal   Collection Time: 01/18/16  9:29 PM  Result Value Ref Range   Glucose-Capillary 127 (H) 65 - 99 mg/dL  Glucose, capillary     Status: None   Collection Time: 01/19/16  1:11 AM  Result Value Ref Range   Glucose-Capillary 75 65 - 99 mg/dL  Glucose, capillary     Status: None   Collection Time: 01/19/16  5:20 AM  Result Value Ref Range   Glucose-Capillary 73 65 - 99 mg/dL  Glucose, capillary     Status: None   Collection Time: 01/19/16  8:58 AM  Result Value Ref Range   Glucose-Capillary 74 65 - 99 mg/dL  Glucose, capillary     Status: Abnormal   Collection Time: 01/19/16  3:00 PM  Result Value Ref Range   Glucose-Capillary 64 (L) 65 - 99 mg/dL  Glucose, capillary     Status: None   Collection Time: 01/19/16  6:25 PM  Result Value Ref Range   Glucose-Capillary 74 65 - 99 mg/dL  CBC     Status: Abnormal   Collection Time: 01/20/16  6:23 AM  Result Value Ref Range   WBC 8.1 3.6 - 11.0 K/uL   RBC 3.60 (L) 3.80 - 5.20 MIL/uL   Hemoglobin 9.5 (L) 12.0 - 16.0 g/dL   HCT 16.129.1 (L) 09.635.0 - 04.547.0 %  MCV 80.8 80.0 - 100.0 fL   MCH 26.5 26.0 - 34.0 pg   MCHC 32.8 32.0 - 36.0 g/dL   RDW 82.916.5 (H) 56.211.5 - 13.014.5 %   Platelets 148 (L) 150 - 440 K/uL    Assessment:   40 y.o. [id number] postpartum day # 1 TSVD, GHTN vs mild preeclampsia, and GDM  Plan:    1) Acute blood loss anemia - hemodynamically stable and asymptomatic - po ferrous sulfate  2) --/--/O POS (11/13 2137) / Rubella Immune / Varicella Immune  3) TDAP status  Declines, influenza declines  4) Breast/Natural Family planning  5) GDM - 2-hr OGTT postpartum, no further BG checks required  6) Elevated BP's - mild thrombocyopenia platelets 199k to 149K postpartum, check P/C ratio and CMP  7) Disposition anticipate discharge PPD2

## 2016-01-20 NOTE — Anesthesia Postprocedure Evaluation (Signed)
Anesthesia Post Note  Patient: Ashley Chaney  Procedure(s) Performed: * No procedures listed *  Patient location during evaluation: Mother Baby Anesthesia Type: Epidural Level of consciousness: lethargic Pain management: pain level controlled Vital Signs Assessment: post-procedure vital signs reviewed and stable Respiratory status: spontaneous breathing and nonlabored ventilation Cardiovascular status: blood pressure returned to baseline and stable Postop Assessment: no headache and no backache Anesthetic complications: no Comments: Pt sleepy, talks with eyes closed, but appropriate.  Moves feet and legs like normal, has been up to void.    Last Vitals:  Vitals:   01/20/16 0239 01/20/16 0357  BP: 130/70 125/64  Pulse: 62 60  Resp: 16 18  Temp:  36.6 C    Last Pain:  Vitals:   01/20/16 0720  TempSrc:   PainSc: 0-No pain                 Vernie MurdersPope,  Gary Gabrielsen G

## 2016-01-21 MED ORDER — IBUPROFEN 600 MG PO TABS: 600.0000 mg | ORAL_TABLET | Freq: Four times a day (QID) | ORAL | 0 refills | Status: AC

## 2016-01-21 NOTE — Progress Notes (Signed)
D/C instructions provided, pt states understanding, aware of follow up appt. Prescriptions given to pt. Pt will remain in room with infant who is now a pediatric pt.

## 2016-01-24 ENCOUNTER — Emergency Department
Admission: EM | Admit: 2016-01-24 | Discharge: 2016-01-25 | Disposition: A | Discharge status: Home / Self Care | Payer: Self-pay | Attending: Emergency Medicine | Admitting: Emergency Medicine

## 2016-01-24 ENCOUNTER — Emergency Department: Payer: Self-pay

## 2016-01-24 ENCOUNTER — Encounter: Payer: Self-pay | Admitting: Emergency Medicine

## 2016-01-24 DIAGNOSIS — R0600 Dyspnea, unspecified: Secondary | ICD-10-CM

## 2016-01-24 DIAGNOSIS — G4730 Sleep apnea, unspecified: Secondary | ICD-10-CM | POA: Insufficient documentation

## 2016-01-24 LAB — COMPREHENSIVE METABOLIC PANEL
ALBUMIN: 3.1 g/dL — AB (ref 3.5–5.0)
ALT: 10 U/L — ABNORMAL LOW (ref 14–54)
ANION GAP: 8 (ref 5–15)
AST: 18 U/L (ref 15–41)
Alkaline Phosphatase: 120 U/L (ref 38–126)
BUN: 14 mg/dL (ref 6–20)
CHLORIDE: 106 mmol/L (ref 101–111)
CO2: 24 mmol/L (ref 22–32)
Calcium: 8.4 mg/dL — ABNORMAL LOW (ref 8.9–10.3)
Creatinine, Ser: 0.73 mg/dL (ref 0.44–1.00)
GFR calc Af Amer: >60 mL/min (ref >60)
GFR calc non Af Amer: >60 mL/min (ref >60)
GLUCOSE: 90 mg/dL (ref 65–99)
POTASSIUM: 3.9 mmol/L (ref 3.5–5.1)
SODIUM: 138 mmol/L (ref 135–145)
TOTAL PROTEIN: 6.8 g/dL (ref 6.5–8.1)
Total Bilirubin: 0.3 mg/dL (ref 0.3–1.2)

## 2016-01-24 LAB — CBC WITH DIFFERENTIAL/PLATELET
BASOS ABS: 0 10*3/uL (ref 0–0.1)
BASOS PCT: 1 %
EOS ABS: 0.1 10*3/uL (ref 0–0.7)
Eosinophils Relative: 2 %
HCT: 32 % — ABNORMAL LOW (ref 35.0–47.0)
Hemoglobin: 10.7 g/dL — ABNORMAL LOW (ref 12.0–16.0)
Lymphocytes Relative: 20 %
Lymphs Abs: 1.3 10*3/uL (ref 1.0–3.6)
MCH: 27.5 pg (ref 26.0–34.0)
MCHC: 33.4 g/dL (ref 32.0–36.0)
MCV: 82.4 fL (ref 80.0–100.0)
MONO ABS: 0.7 10*3/uL (ref 0.2–0.9)
MONOS PCT: 11 %
Neutro Abs: 4.5 10*3/uL (ref 1.4–6.5)
Neutrophils Relative %: 68 %
PLATELETS: 237 10*3/uL (ref 150–440)
RBC: 3.89 MIL/uL (ref 3.80–5.20)
RDW: 16.9 % — ABNORMAL HIGH (ref 11.5–14.5)
WBC: 6.7 10*3/uL (ref 3.6–11.0)

## 2016-01-24 LAB — TROPONIN I

## 2016-01-24 LAB — BRAIN NATRIURETIC PEPTIDE: B NATRIURETIC PEPTIDE 5: 95 pg/mL (ref 0.0–100.0)

## 2016-01-24 LAB — URINALYSIS COMPLETE WITH MICROSCOPIC (ARMC ONLY)
BILIRUBIN URINE: NEGATIVE
Bacteria, UA: NONE SEEN
GLUCOSE, UA: NEGATIVE mg/dL
Ketones, ur: NEGATIVE mg/dL
NITRITE: NEGATIVE
Protein, ur: 30 mg/dL — AB
Specific Gravity, Urine: 1.02 (ref 1.005–1.030)
pH: 6 (ref 5.0–8.0)

## 2016-01-24 LAB — PROTEIN / CREATININE RATIO, URINE
Creatinine, Urine: 163 mg/dL
PROTEIN CREATININE RATIO: 0.15 mg/mg{creat} (ref 0.00–0.15)
Total Protein, Urine: 25 mg/dL

## 2016-01-24 LAB — GLUCOSE, CAPILLARY: GLUCOSE-CAPILLARY: 62 mg/dL — AB (ref 65–99)

## 2016-01-24 NOTE — ED Notes (Signed)
Patient had epidural with delivery. Was in labor for 75 hours. Was on amoxicillin during delivery and pitocin. Jolts awake while trying to fall asleep feels like throat is closing. Does have PVC's.

## 2016-01-24 NOTE — ED Provider Notes (Signed)
[facility] Emergency Department Provider Note  ____________________________________________   I have reviewed the triage vital signs and the nursing notes.   HISTORY  Chief Complaint Leg Swelling and Shortness of Breath    HPI Ashley Chaney is a 40 y.o. female Who is just given birth 5 days ago to her seventh vaginal delivery a prolonged labor. She did receivea lot of IV fluid she states. She was also on Pitocin and multiple other induction agents.patient does have mild leg swelling during the pharmacy what is become much worse since she gave birth. Bilateral symmetric. She also states that when she sleeps, every second or third breath, she feelslike it gets stuck in her throat She is not actually having chest pain or shortness of breath and she is able to ambulate with no dyspnea She denies any chest pain. She does not have any symptoms unless he tries to sleep and that she feels that her breath gets stuck in her throat. She states that she did have a similar set of symptoms with her first pregnancy.the patient states that she has no history of eclampsia. She's had no hives, she has no sensation of throat swelling at this time. She states is only when she sleeps. It is not associated with orthopnea as it is also present when she sits up. She states she took Benadryl yesterday and all of her symptoms went away but then they came back today. She also states that her leg swelling went away when she took Benadryl.   She did discuss with Dr. Jean Rosenthal and they ultimately decided they would be best for her to come in   Past Medical History:  Diagnosis Date  . Gestational diabetes     Patient Active Problem List   Diagnosis Date Noted  . Gestational diabetes 01/19/2016  . Labor and delivery indication for care or intervention 01/16/2016    Past Surgical History:  Procedure Laterality Date  . KNEE CARTILAGE SURGERY      Prior to Admission medications    Medication Sig Start Date End Date Taking? Authorizing Provider  ibuprofen (ADVIL,MOTRIN) 600 MG tablet Take 1 tablet (600 mg total) by mouth every 6 (six) hours. 01/21/16   Conard Novak, MD  IRON, FERROUS SULFATE, PO Take 65 mg by mouth daily.    Historical Provider, MD  Prenatal Vit-Fe Fumarate-FA (PRENATAL ONE DAILY PO) Take 1 tablet by mouth daily.    Historical Provider, MD    Allergies Amoxicillin  No family history on file.  Social History Social History  Substance Use Topics  . Smoking status: Never Smoker  . Smokeless tobacco: Never Used  . Alcohol use No     Comment: "communion wine"    Review of Systems Constitutional: No fever/chills Eyes: No visual changes. ENT: No sore throat. No stiff neck no neck pain Cardiovascular: Denies chest pain. Respiratory: see historyGastrointestinal:   no vomiting.  No diarrhea.  No constipation. Genitourinary: Negative for dysuria. Musculoskeletal: positive lower extremity swelling Skin: Negative for rash. Neurological: Negative for severe headaches, focal weakness or numbness. 10-point ROS otherwise negative.  ____________________________________________   PHYSICAL EXAM:  VITAL SIGNS: ED Triage Vitals  Enc Vitals Group     BP 01/24/16 1735 (!) 135/95     Pulse Rate 01/24/16 1735 85     Resp 01/24/16 1735 18     Temp 01/24/16 1735 98 F (36.7 C)     Temp Source 01/24/16 1735 Oral     SpO2 01/24/16 1735  99 %     Weight 01/24/16 1737 281 lb 6.4 oz (127.6 kg)     Height 01/24/16 1737 5\' 10"  (1.778 m)     Head Circumference --      Peak Flow --      Pain Score 01/24/16 1735 0     Pain Loc --      Pain Edu? --      Excl. in GC? --     Constitutional: Alert and oriented. Well appearing and in no acute distress. Eyes: Conjunctivae are normal. PERRL. EOMI. Head: Atraumatic. Nose: No congestion/rhinnorhea. Mouth/Throat: Mucous membranes are moist.  Oropharynx non-erythematous. Neck: No stridor.   Nontender with no  meningismus no hoarse voice Cardiovascular: Normal rate, regular rhythm. Grossly normal heart sounds.  Good peripheral circulation. Respiratory: Normal respiratory effort.  No retractions. Lungs CTAB. Abdominal: Soft and nontender. No distention. No guarding no rebound Back:  There is no focal tenderness or step off.  there is no midline tenderness there are no lesions noted. there is no CVA tenderness  Musculoskeletal: No lower extremity tenderness, no upper extremity tenderness. No joint effusions, no DVT signs strong distal pulses symmetric  Bilateral pitting edema Neurologic:  Normal speech and language. No gross focal neurologic deficits are appreciated.  Skin:  Skin is warm, dry and intact. No rash noted. Psychiatric: Mood and affect are normal. Speech and behavior are normal.  ____________________________________________   LABS (all labs ordered are listed, but only abnormal results are displayed)  Labs Reviewed  URINALYSIS COMPLETEWITH MICROSCOPIC ([facility] ONLY) - Abnormal; Notable for the following:       Result Value   Color, Urine YELLOW (*)    APPearance CLEAR (*)    Hgb urine dipstick 3+ (*)    Protein, ur 30 (*)    Leukocytes, UA TRACE (*)    Squamous Epithelial / LPF 0-5 (*)    All other components within normal limits  CBC WITH DIFFERENTIAL/PLATELET - Abnormal; Notable for the following:    Hemoglobin 10.7 (*)    HCT 32.0 (*)    RDW 16.9 (*)    All other components within normal limits  TROPONIN I  COMPREHENSIVE METABOLIC PANEL  URINALYSIS COMPLETEWITH MICROSCOPIC ([facility] ONLY)  PROTEIN / CREATININE RATIO, URINE  BRAIN NATRIURETIC PEPTIDE   ____________________________________________  EKG  I personally interpreted any EKGs ordered by me or triage Rate 82 bpm no acute ST elevation or depression borderline LAD ____________________________________________  RADIOLOGY  I reviewed any imaging ordered by me or triage that were performed during my shift and, if  possible, patient and/or family made aware of any abnormal findings. ____________________________________________   PROCEDURES  Procedure(s) performed: None  Procedures  Critical Care performed: None  ____________________________________________   INITIAL IMPRESSION / ASSESSMENT AND PLAN / ED COURSE  Pertinent labs & imaging results that were available during my care of the patient were reviewed by me and considered in my medical decision making (see chart for details).  Patient here with somewhat atypical symptoms in the postpartum period of leg swelling, which is not unusual, but a sensation that when she sleeps she has a difficult time getting air down her throat  . Differential includes allergic reaction although no evidence of it on exam, generalized edema causing her some airway issue when her airway collapse is during sleep which I suspect is most likely, there is low suspicion for eclampsia or preeclampsia given her blood pressure which at this time is 127 over palp. Low suspicion for PE  given the lack of real pleuritic symptoms or dyspnea. Possibility of postpartumcardiomyopathy does exist. I did consult with Dr. Jean RosenthalJackson, who agrees with management and I will sign out at the end of my shift to dr. Darnelle Catalanmalinda who will f/u on findings and d/w dr Jean Rosenthaljackson after they are back  Clinical Course    ____________________________________________   FINAL CLINICAL IMPRESSION(S) / ED DIAGNOSES  Final diagnoses:  None      This chart was dictated using voice recognition software.  Despite best efforts to proofread,  errors can occur which can change meaning.      Jeanmarie PlantJames A McShane, MD 01/24/16 2002

## 2016-01-24 NOTE — ED Triage Notes (Signed)
Patient presents to the ED with bilateral swelling to her legs and feet, and intermittent shortness of breath and sore throat, worse when she sleeps.  Patient states, "when I try to sleep, I wake up unable to breathe."  Patient denies difficulty breathing when she is awake.  Patient states she gave birth on Thursday, vaginal delivery.  Patient states if she takes benadryl her symptoms are improved.

## 2016-01-25 NOTE — ED Notes (Signed)
Pt discharged to home.  Family member driving.  Discharge instructions reviewed.  Verbalized understanding.  No questions or concerns at this time.  Teach back verified.  Pt in NAD.  No items left in ED.   

## 2016-01-25 NOTE — ED Provider Notes (Signed)
Labs x-rays etc. reviewed and discussed with Dr. Jean RosenthalJackson on call for OB. Patient reports she does not have any trouble shortness of breath until she tries to go to sleep and when she goes to sleep she sleeps for just short bit of time and wakes up feeling like she can't breathe. She breathes normally otherwise. She has tried sleeping on her back on her side in a recliner she always wakes up after just short time feeling like she can't breathe. In the ER. She is unable to sleep because since she goes to sleep the apnea alarm goes off wakes her up. She does not appear to be sat in the ER. She appears by history at least to have sleep apnea. I discussed her case with the hospitalist and Dr. Romeo Appleonn on call for pulmonary. No one can come up with a good way to treat her. She will need to be seen in the office to get a sleep study. I will have her go to see Dr. Jean RosenthalJackson to refer her to for sleep study or she can try primary care for the same reason. She is to return if she is worse.   Arnaldo NatalPaul F Ysela Hettinger, MD 01/25/16 [phone]

## 2016-01-25 NOTE — Discharge Instructions (Signed)
Please follow-up with Dr. Jean RosenthalJackson OB/GY[facility]. He can try to refer you for a sleep study. This may take a few days especially with the holiday. Alternatively he can try and follow up with primary care or go to clinic acute-care or any other doctor of your choice. They can also refer you for a sleep study. I discussed her case with the hospital doctor and the pulmonary medicine doctor both of them agree that there is nothing much I can do free tonight and he will have to get referred for sleep study.

## 2018-04-25 IMAGING — CR DG CHEST 2V
2 series · 2 of 2 positions shown · non-contrast
Comparison: None.

CLINICAL DATA: Bilateral leg and feet swelling

EXAM:
CHEST  2 VIEW

[chest pa]
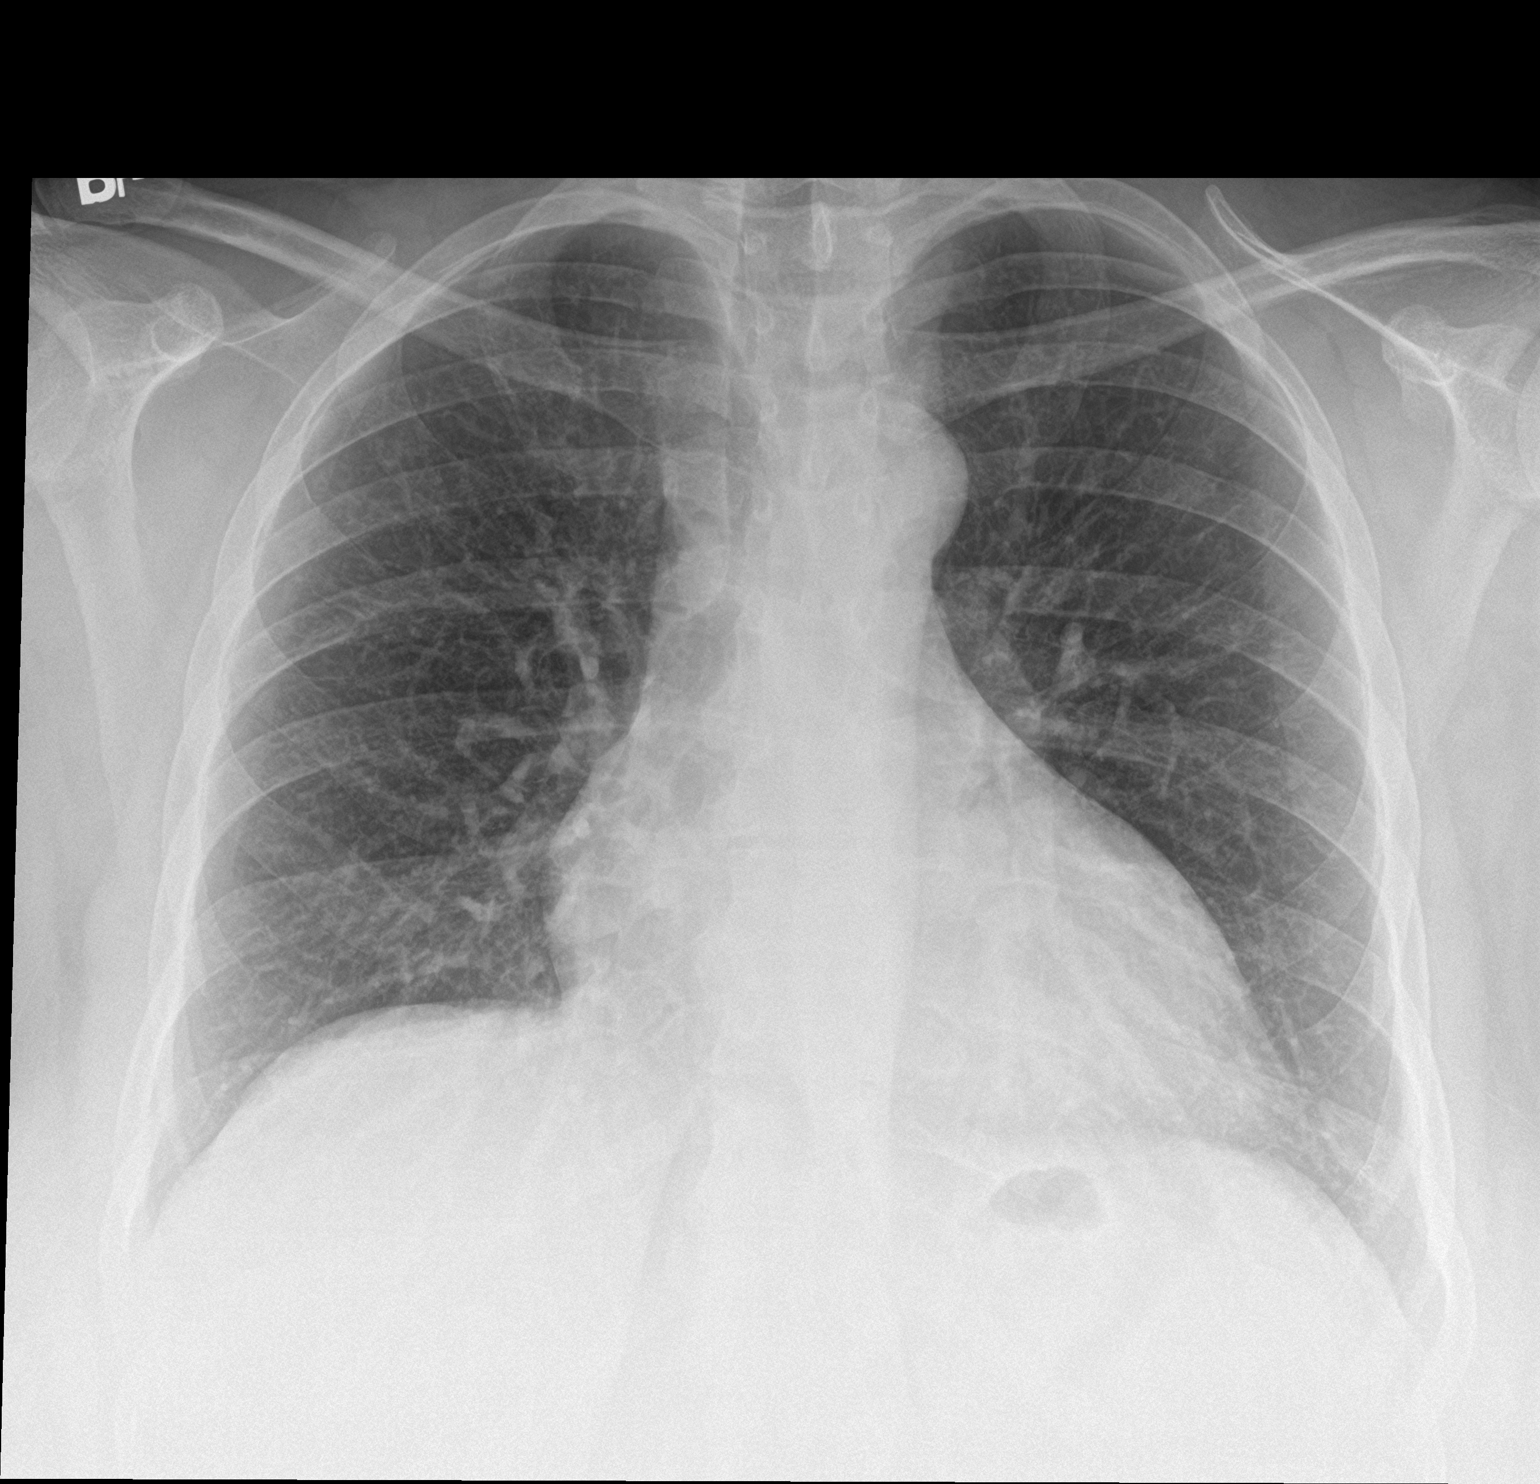

[chest lat]
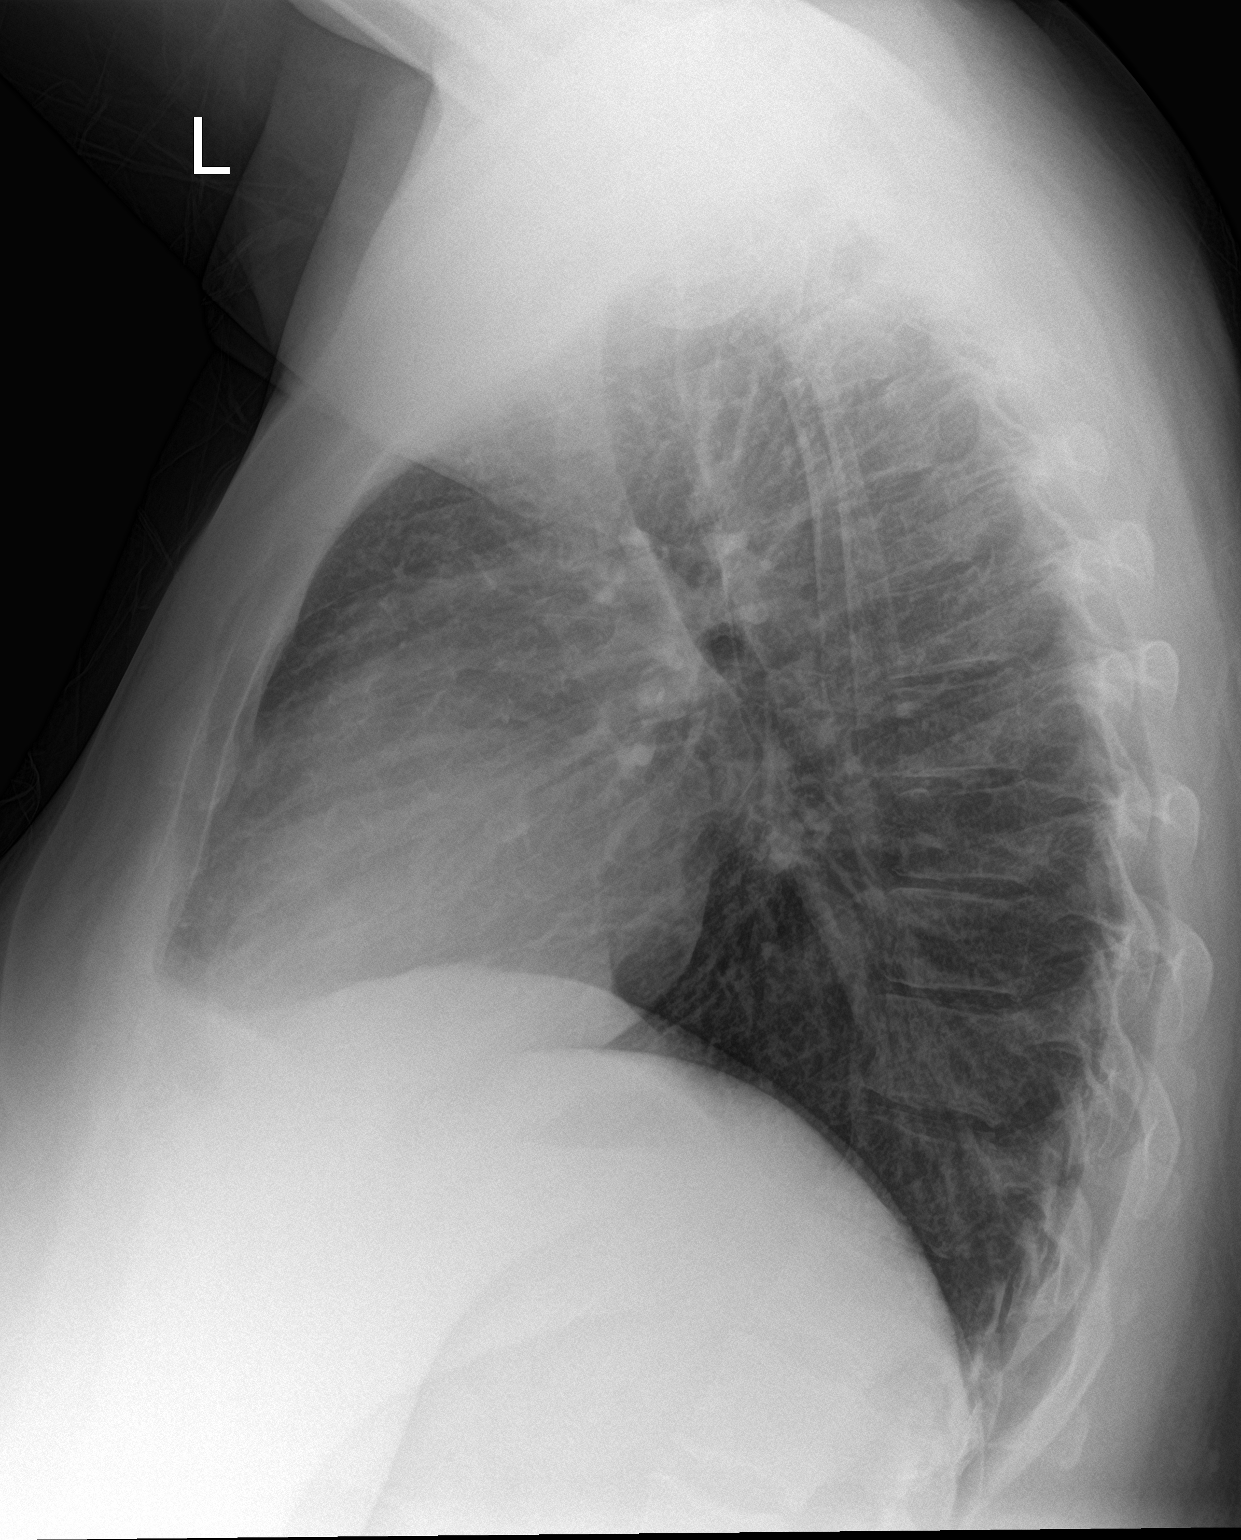

[2 of 2 positions shown; findings below may reference images not displayed]

FINDINGS: Borderline cardiomegaly. No infiltrate or pleural effusion. No
pulmonary edema. Bony thorax is unremarkable.
IMPRESSION: No active cardiopulmonary disease.
